# Patient Record
Sex: Female | Born: 1970 | Race: White | Hispanic: No | Marital: Married | Smoking: Never smoker
Health system: Southern US, Community
[De-identification: ages and names within clinical notes are randomized; demographics above are authoritative.]

## PROBLEM LIST (undated history)

## (undated) DIAGNOSIS — T7840XA Allergy, unspecified, initial encounter: Secondary | ICD-10-CM

## (undated) DIAGNOSIS — K219 Gastro-esophageal reflux disease without esophagitis: Secondary | ICD-10-CM

## (undated) DIAGNOSIS — N979 Female infertility, unspecified: Secondary | ICD-10-CM

## (undated) DIAGNOSIS — M1990 Unspecified osteoarthritis, unspecified site: Secondary | ICD-10-CM

## (undated) HISTORY — DX: Female infertility, unspecified: N97.9

## (undated) HISTORY — DX: Allergy, unspecified, initial encounter: T78.40XA

## (undated) HISTORY — PX: OTHER SURGICAL HISTORY: SHX169

## (undated) HISTORY — DX: Unspecified osteoarthritis, unspecified site: M19.90

## (undated) HISTORY — DX: Gastro-esophageal reflux disease without esophagitis: K21.9

---

## 2008-11-11 HISTORY — PX: LAPAROTOMY: SHX154

## 2010-10-18 ENCOUNTER — Encounter
Admission: RE | Admit: 2010-10-18 | Discharge: 2010-10-18 | Payer: Self-pay | Source: Home / Self Care | Admitting: Gynecology

## 2011-10-16 ENCOUNTER — Other Ambulatory Visit: Payer: Self-pay | Admitting: Gynecology

## 2011-10-16 DIAGNOSIS — Z1231 Encounter for screening mammogram for malignant neoplasm of breast: Secondary | ICD-10-CM

## 2011-11-11 ENCOUNTER — Ambulatory Visit: Payer: Self-pay

## 2011-11-25 ENCOUNTER — Ambulatory Visit
Admission: RE | Admit: 2011-11-25 | Discharge: 2011-11-25 | Disposition: A | Discharge status: Home / Self Care | Payer: BC Managed Care – PPO | Source: Ambulatory Visit | Attending: Gynecology | Admitting: Gynecology

## 2011-11-25 DIAGNOSIS — Z1231 Encounter for screening mammogram for malignant neoplasm of breast: Secondary | ICD-10-CM

## 2014-09-08 ENCOUNTER — Other Ambulatory Visit: Payer: Self-pay

## 2014-09-08 DIAGNOSIS — Z1231 Encounter for screening mammogram for malignant neoplasm of breast: Secondary | ICD-10-CM

## 2014-09-27 ENCOUNTER — Ambulatory Visit: Payer: BC Managed Care – PPO

## 2014-10-11 ENCOUNTER — Ambulatory Visit
Admission: RE | Admit: 2014-10-11 | Discharge: 2014-10-11 | Disposition: A | Discharge status: Home / Self Care | Payer: BC Managed Care – PPO | Source: Ambulatory Visit

## 2014-10-11 DIAGNOSIS — Z1231 Encounter for screening mammogram for malignant neoplasm of breast: Secondary | ICD-10-CM

## 2014-10-11 LAB — HM MAMMOGRAPHY

## 2015-04-07 LAB — HM PAP SMEAR

## 2015-05-24 ENCOUNTER — Encounter: Payer: Self-pay | Admitting: Behavioral Health

## 2015-05-24 ENCOUNTER — Telehealth: Payer: Self-pay | Admitting: Behavioral Health

## 2015-05-24 NOTE — Telephone Encounter (Signed)
Unable to reach patient at time of Pre-Visit Call.  Left message for patient to return call when available.    

## 2015-05-24 NOTE — Addendum Note (Signed)
Addended by: Harold BarbanBYRD, Reshonda Koerber E on: 05/24/2015 12:09 PM   Modules accepted: Medications

## 2015-05-24 NOTE — Telephone Encounter (Signed)
Pre-Visit Call completed with patient and chart updated.   Pre-Visit Info documented in Specialty Comments under [employer].    

## 2015-05-25 ENCOUNTER — Encounter: Payer: Self-pay | Admitting: Medical

## 2015-05-25 ENCOUNTER — Ambulatory Visit (HOSPITAL_BASED_OUTPATIENT_CLINIC_OR_DEPARTMENT_OTHER)
Admission: RE | Admit: 2015-05-25 | Discharge: 2015-05-25 | Disposition: A | Discharge status: Home / Self Care | Payer: BLUE CROSS/BLUE SHIELD | Source: Ambulatory Visit | Attending: Medical | Admitting: Medical

## 2015-05-25 ENCOUNTER — Ambulatory Visit (INDEPENDENT_AMBULATORY_CARE_PROVIDER_SITE_OTHER): Payer: BLUE CROSS/BLUE SHIELD | Admitting: Medical

## 2015-05-25 VITALS — BP 126/65 | HR 69 | Temp 98.2°F | Ht 65.75 in | Wt 155.4 lb

## 2015-05-25 DIAGNOSIS — M549 Dorsalgia, unspecified: Secondary | ICD-10-CM | POA: Insufficient documentation

## 2015-05-25 DIAGNOSIS — M940 Chondrocostal junction syndrome [Tietze]: Secondary | ICD-10-CM | POA: Diagnosis not present

## 2015-05-25 DIAGNOSIS — M1990 Unspecified osteoarthritis, unspecified site: Secondary | ICD-10-CM | POA: Diagnosis not present

## 2015-05-25 DIAGNOSIS — M5441 Lumbago with sciatica, right side: Secondary | ICD-10-CM

## 2015-05-25 DIAGNOSIS — M545 Low back pain: Secondary | ICD-10-CM | POA: Insufficient documentation

## 2015-05-25 DIAGNOSIS — J301 Allergic rhinitis due to pollen: Secondary | ICD-10-CM

## 2015-05-25 DIAGNOSIS — M5442 Lumbago with sciatica, left side: Secondary | ICD-10-CM

## 2015-05-25 DIAGNOSIS — J309 Allergic rhinitis, unspecified: Secondary | ICD-10-CM | POA: Insufficient documentation

## 2015-05-25 MED ORDER — DICLOFENAC SODIUM 75 MG PO TBEC
75.0000 mg | DELAYED_RELEASE_TABLET | Freq: Two times a day (BID) | ORAL | Status: AC
Start: 2015-05-25 — End: ?

## 2015-05-25 MED ORDER — HYDROCODONE-ACETAMINOPHEN 5-325 MG PO TABS: 1.0000 | ORAL_TABLET | Freq: Four times a day (QID) | ORAL | Status: AC | PRN

## 2015-05-25 MED ORDER — CYCLOBENZAPRINE HCL 10 MG PO TABS: 10.0000 mg | ORAL_TABLET | Freq: Every day | ORAL | Status: AC

## 2015-05-25 NOTE — Progress Notes (Signed)
Pre visit review using our clinic review tool, if applicable. No additional management support is needed unless otherwise documented below in the visit note. 

## 2015-05-25 NOTE — Assessment & Plan Note (Signed)
Diclofenac, flexeril, and norco. Back stretching exercises. If not improved by one week then refer to PT.  If any severe radiating pain to legs, numbness weakness, or foot drop then ED eval.  Follow up in 7 days or as needed

## 2015-05-25 NOTE — Progress Notes (Signed)
Subjective:    Patient ID: Melanie Stanley, female    DOB: 1971/02/08, 44 y.o.   MRN: [id number]  HPI   I have reviewed pt PMH, PSH, FH, Social History and Surgical History.  Allergist- in spring mostly. otc claritin or zytrec when needs.  Arthritis- pt states in hand. Told osteoarthritis. Use meloxicam in the past briefly.  Gerd- Pt states hx of omeprazole use. Uses 20 mg in past. No use for one month. Current nt no symptoms. No egd in past.   Costochondritis- rt lower rib area in past. Work up for GB in past was negative.    Pt in with low back pain that started after a trampoline class. Pt states doing this for about a month. Pt did one hour of class. 30 minute into class started to feel the pain. Recently on alleve. Pain started on Saturday. Pt states pain level 9 now. Some pain since fall radiates down her legs. No saddle anesthesia. No bladder incontinence.  Lower lumbar region pain and some lt SI area pain as well. SI area pain before the back pain.  Pt using heating pad and pillow.  Hx of lower back pain. In past occasional pain mild self limited. No treatment in past.  LMP- Mid June. Not late. Pt is going through fertility evaluation. But no treatment now.     Review of Systems  Constitutional: Negative for fever, chills and fatigue.  Respiratory: Negative for cough, chest tightness and wheezing.   Cardiovascular: Negative for chest pain and palpitations.  Gastrointestinal: Negative for nausea, vomiting and abdominal pain.  Genitourinary: Negative for dysuria, urgency, hematuria and flank pain.  Musculoskeletal: Positive for back pain. Negative for myalgias, joint swelling, arthralgias, neck pain and neck stiffness.  Skin: Negative for rash.  Neurological: Negative for weakness and numbness.  Hematological: Negative for adenopathy. Does not bruise/bleed easily.     Past Medical History  Diagnosis Date  . Osteoarthritis   . Infertility, female   . Allergy   .  GERD (gastroesophageal reflux disease)     History   Social History  . Marital Status: Married    Spouse Name: N/A  . Number of Children: N/A  . Years of Education: N/A   Occupational History  . Not on file.   Social History Main Topics  . Smoking status: Never Smoker   . Smokeless tobacco: Never Used  . Alcohol Use: 0.0 oz/week    0 Standard drinks or equivalent per week     Comment: 5 drinks on weekends  . Drug Use: No  . Sexual Activity: Yes   Other Topics Concern  . Not on file   Social History Narrative    Past Surgical History  Procedure Laterality Date  . Laparotomy  2010  . Laporotomy    . Laporoscopy      Family History  Problem Relation Age of Onset  . Arthritis Mother   . Arthritis Maternal Aunt   . Diabetes Maternal Aunt   . Arthritis Maternal Grandmother     No Known Allergies  Current Outpatient Prescriptions on File Prior to Visit  Medication Sig Dispense Refill  . Multiple Vitamins-Minerals (MULTIVITAMIN ADULT PO) Take by mouth daily.    . Naproxen Sodium (ALEVE PO) Take by mouth as needed.     No current facility-administered medications on file prior to visit.    BP 126/65 mmHg  Pulse 69  Temp(Src) 98.2 F (36.8 C) (Oral)  Ht 5' 5.75" (1.67 m)  Wt 155 lb 6.4 oz (70.489 kg)  BMI 25.27 kg/m2  SpO2 100%  LMP 04/26/2015       Objective:   Physical Exam   General Appearance- Not in acute distress.    Chest and Lung Exam Auscultation: Breath sounds:-Normal. Clear even and unlabored. Adventitious sounds:- No Adventitious sounds.  Cardiovascular Auscultation:Rythm - Regular, rate and rythm. Heart Sounds -Normal heart sounds.  Abdomen Inspection:-Inspection Normal.  Palpation/Perucssion: Palpation and Percussion of the abdomen reveal- Non Tender, No Rebound tenderness, No rigidity(Guarding) and No Palpable abdominal masses.  Liver:-Normal.  Spleen:- Normal.   Back Mid lumbar spine tenderness to palpation. Pain on  straight leg lift. Pain on lateral movements and flexion/extension of the spine.  Lower ext neurologic  L5-S1 sensation intact bilaterally. Normal patellar reflexes bilaterally. No foot drop bilaterally.     Assessment & Plan:

## 2015-05-25 NOTE — Patient Instructions (Addendum)
Back pain Diclofenac, flexeril, and norco. Back stretching exercises. If not improved by one week then refer to PT.  If any severe radiating pain to legs, numbness weakness, or foot drop then ED eval.  Follow up in 7 days or as needed  Allergic rhinitis - in spring mostly. otc claritin or zytrec when needs.  Arthritis pt states in hand. Told osteoarthritis. Use meloxicam in the past briefly.  Costochondritis rt lower rib area in past. Work up for GB in past was negative.

## 2015-05-25 NOTE — Assessment & Plan Note (Signed)
pt states in hand. Told osteoarthritis. Use meloxicam in the past briefly.

## 2015-05-25 NOTE — Assessment & Plan Note (Signed)
rt lower rib area in past. Work up for GB in past was negative.

## 2015-05-25 NOTE — Assessment & Plan Note (Signed)
-   in spring mostly. otc claritin or zytrec when needs.

## 2015-12-01 ENCOUNTER — Other Ambulatory Visit: Payer: Self-pay

## 2015-12-01 DIAGNOSIS — Z1231 Encounter for screening mammogram for malignant neoplasm of breast: Secondary | ICD-10-CM

## 2015-12-25 ENCOUNTER — Ambulatory Visit
Admission: RE | Admit: 2015-12-25 | Discharge: 2015-12-25 | Disposition: A | Discharge status: Home / Self Care | Payer: BLUE CROSS/BLUE SHIELD | Source: Ambulatory Visit

## 2015-12-25 DIAGNOSIS — Z1231 Encounter for screening mammogram for malignant neoplasm of breast: Secondary | ICD-10-CM

## 2016-07-15 IMAGING — DX DG LUMBAR SPINE 2-3V
3 series · 3 of 3 positions shown · non-contrast
Comparison: None.

CLINICAL DATA: Low back pain. Pain radiates into lower extremities.

EXAM:
LUMBAR SPINE - 2-3 VIEW

[l-spine ap]
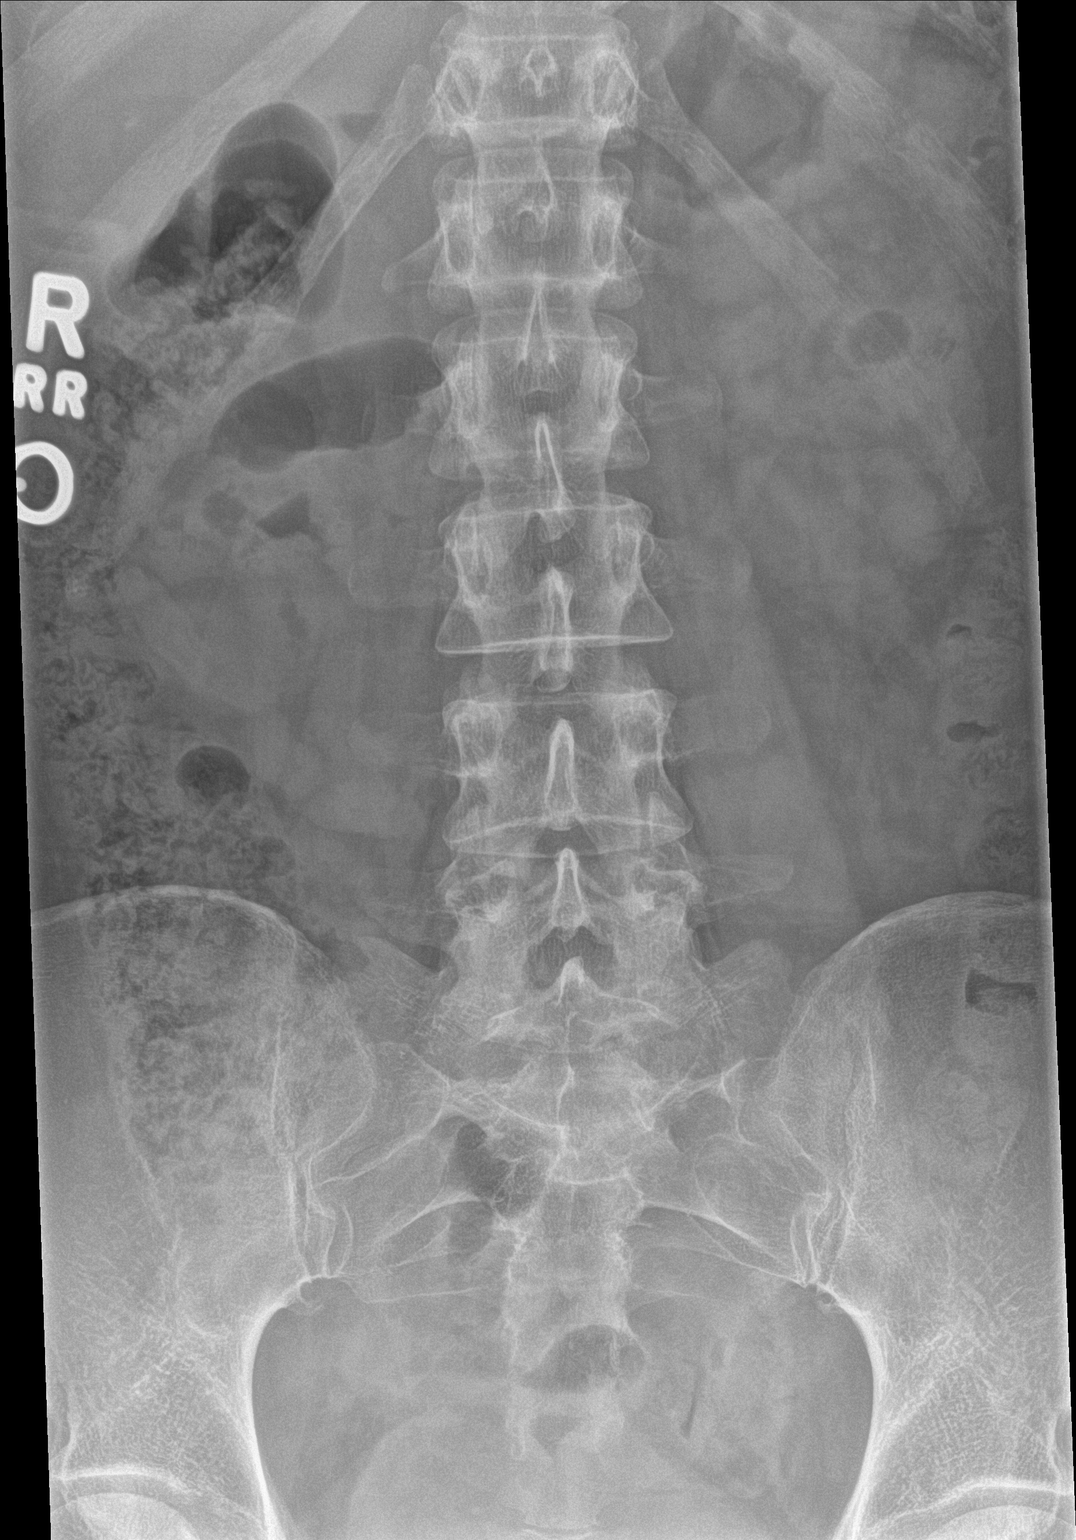

[l-spine lat]
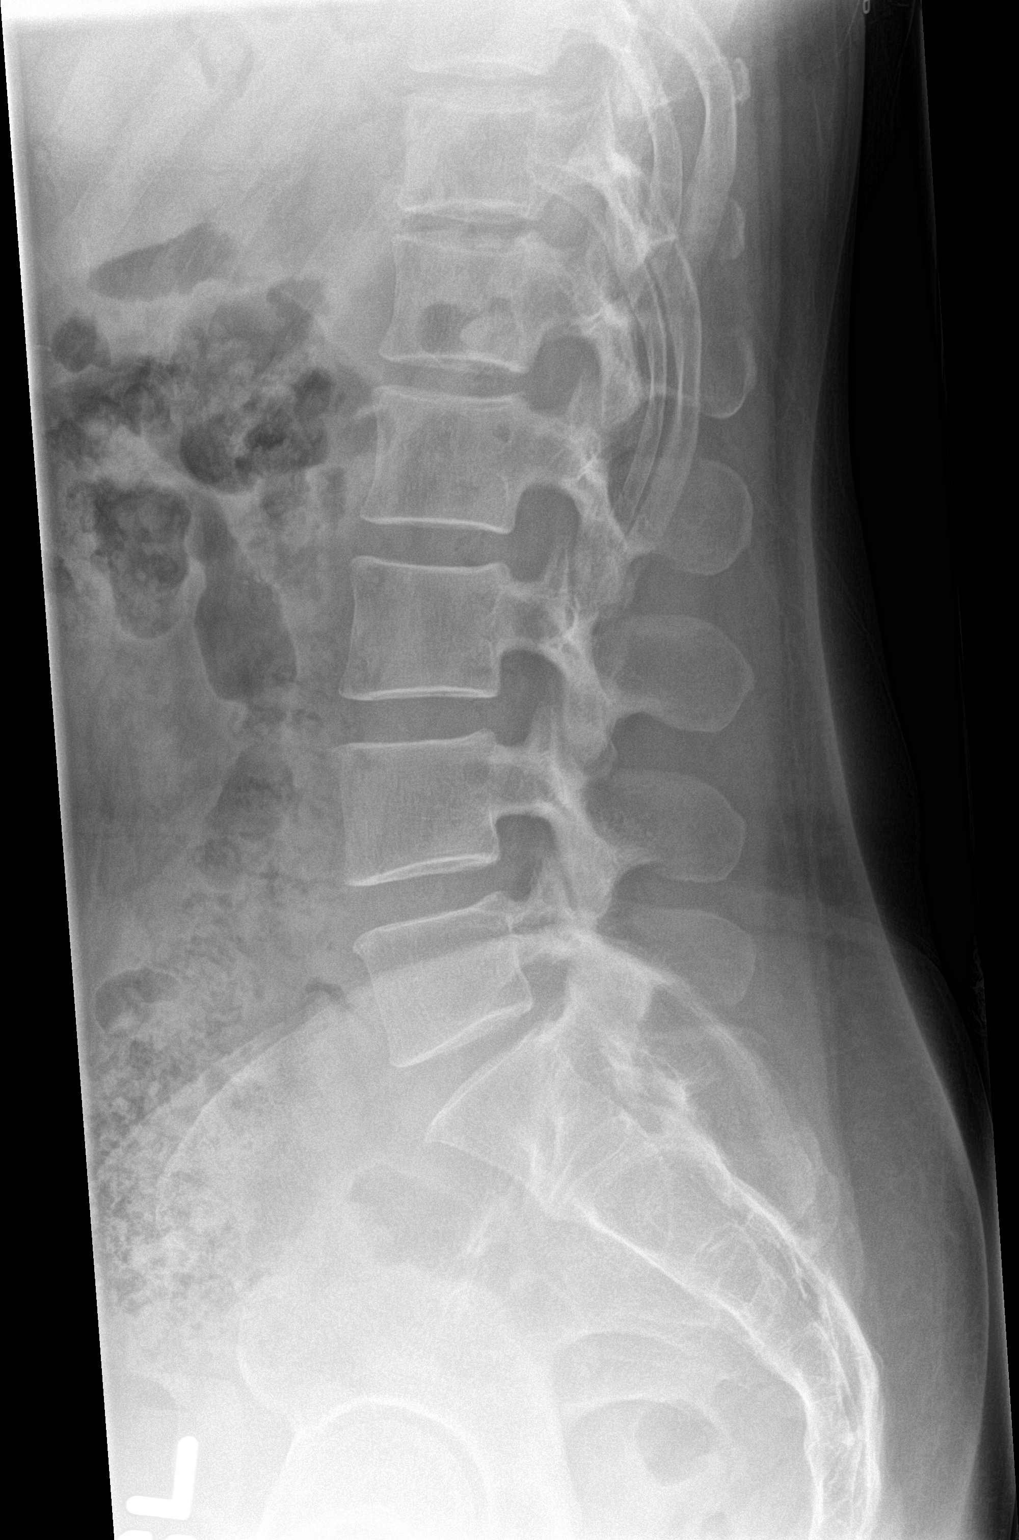

[l-spine spot]
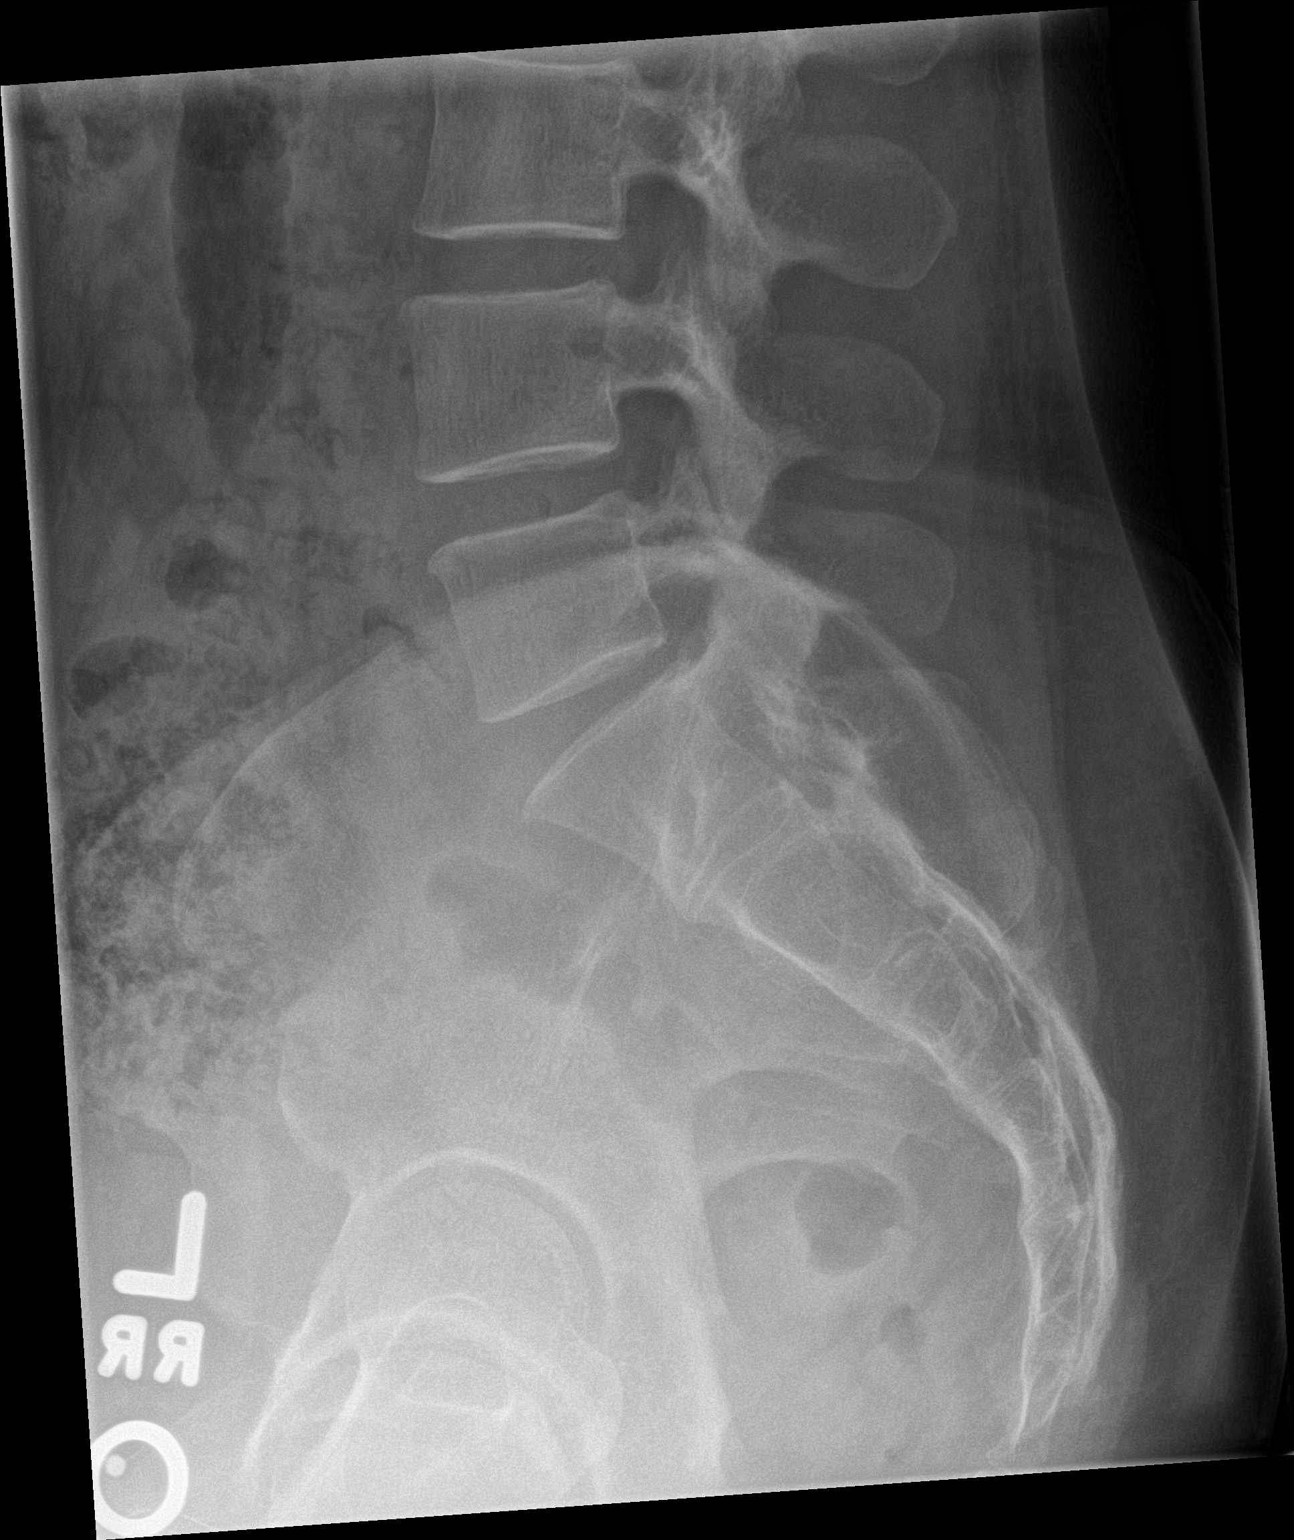

[3 of 3 positions shown; findings below may reference images not displayed]

FINDINGS: Paraspinal soft tissues are unremarkable. No acute bony abnormality
identified . Normal bony alignment and mineralization.
IMPRESSION: No acute or focal bony abnormality identified.

## 2018-03-18 ENCOUNTER — Other Ambulatory Visit: Payer: Self-pay | Admitting: Emergency Medicine

## 2018-03-18 DIAGNOSIS — Z1231 Encounter for screening mammogram for malignant neoplasm of breast: Secondary | ICD-10-CM

## 2018-04-08 ENCOUNTER — Ambulatory Visit: Payer: BLUE CROSS/BLUE SHIELD

## 2018-05-11 ENCOUNTER — Ambulatory Visit: Payer: BLUE CROSS/BLUE SHIELD

## 2018-08-12 ENCOUNTER — Ambulatory Visit
Admission: RE | Admit: 2018-08-12 | Discharge: 2018-08-12 | Disposition: A | Discharge status: Home / Self Care | Payer: BLUE CROSS/BLUE SHIELD | Source: Ambulatory Visit | Attending: Emergency Medicine | Admitting: Emergency Medicine

## 2018-08-12 DIAGNOSIS — Z1231 Encounter for screening mammogram for malignant neoplasm of breast: Secondary | ICD-10-CM

## 2020-06-28 ENCOUNTER — Other Ambulatory Visit: Payer: Self-pay | Admitting: Obstetrics and Gynecology

## 2020-06-28 DIAGNOSIS — Z1231 Encounter for screening mammogram for malignant neoplasm of breast: Secondary | ICD-10-CM

## 2020-07-14 ENCOUNTER — Ambulatory Visit: Payer: BLUE CROSS/BLUE SHIELD

## 2020-07-28 ENCOUNTER — Ambulatory Visit: Payer: Self-pay

## 2020-08-11 ENCOUNTER — Ambulatory Visit: Payer: Self-pay

## 2020-09-01 ENCOUNTER — Ambulatory Visit: Payer: Self-pay

## 2020-09-04 ENCOUNTER — Ambulatory Visit
Admission: RE | Admit: 2020-09-04 | Discharge: 2020-09-04 | Disposition: A | Discharge status: Home / Self Care | Payer: BC Managed Care – PPO | Source: Ambulatory Visit | Attending: Obstetrics and Gynecology | Admitting: Obstetrics and Gynecology

## 2020-09-04 ENCOUNTER — Other Ambulatory Visit: Payer: Self-pay

## 2020-09-04 DIAGNOSIS — Z1231 Encounter for screening mammogram for malignant neoplasm of breast: Secondary | ICD-10-CM

## 2021-10-26 IMAGING — MG DIGITAL SCREENING BILAT W/ TOMO W/ CAD
8 series · 8 of 24 positions shown · non-contrast
Comparison: Previous exam(s).

CLINICAL DATA: Screening.

EXAM:
DIGITAL SCREENING BILATERAL MAMMOGRAM WITH TOMO AND CAD

[R MLO synth-2D]
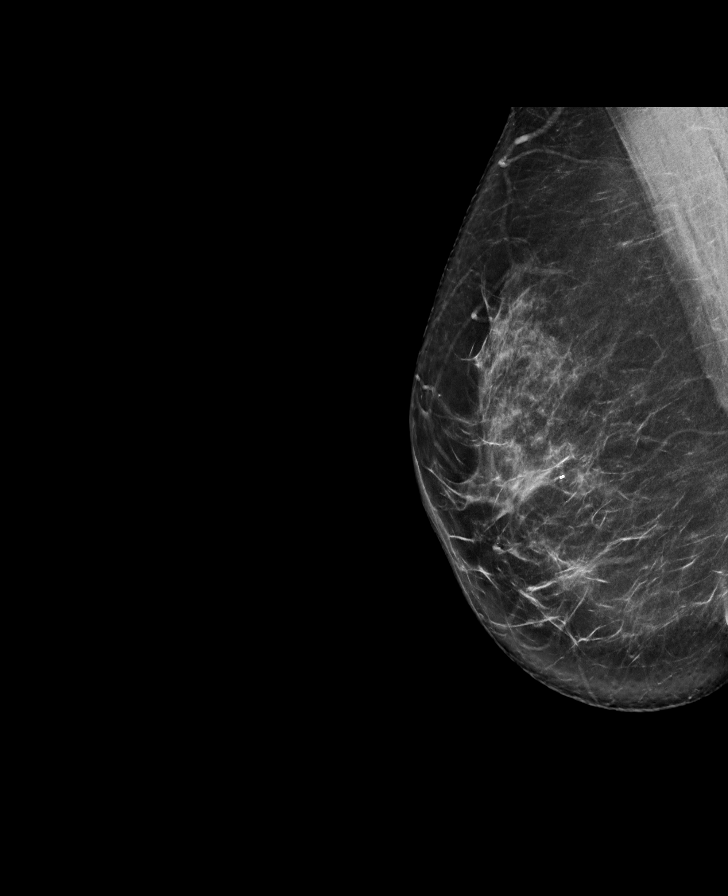

[L MLO synth-2D]
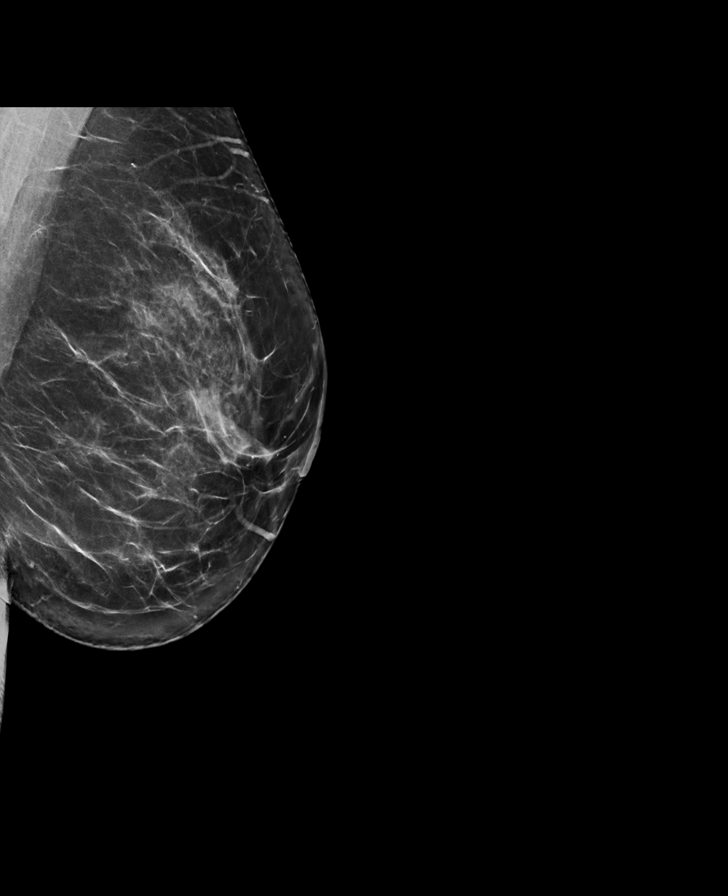

[L CC synth-2D]
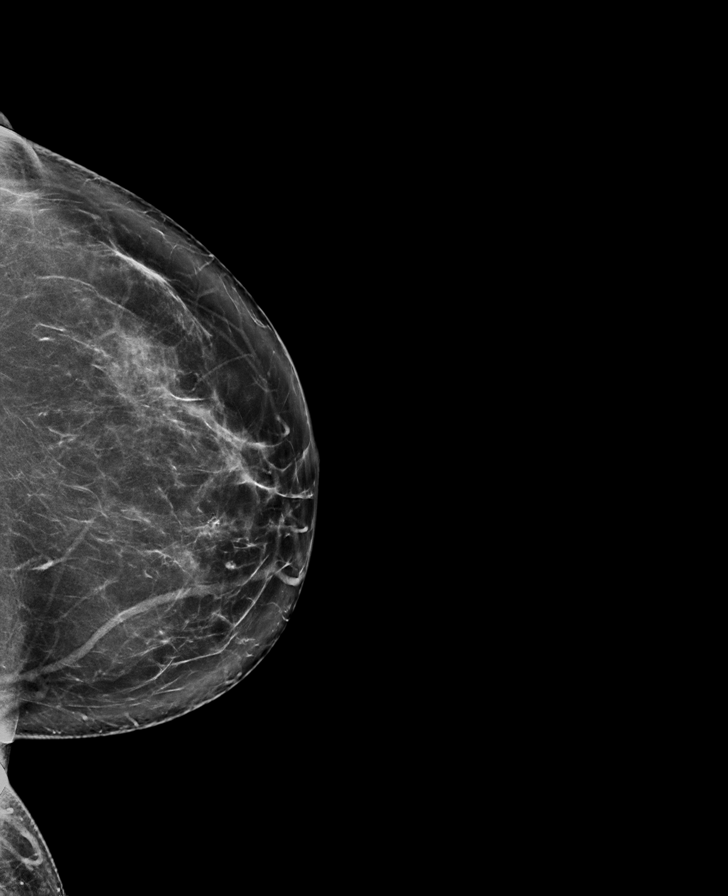

[R CC synth-2D]
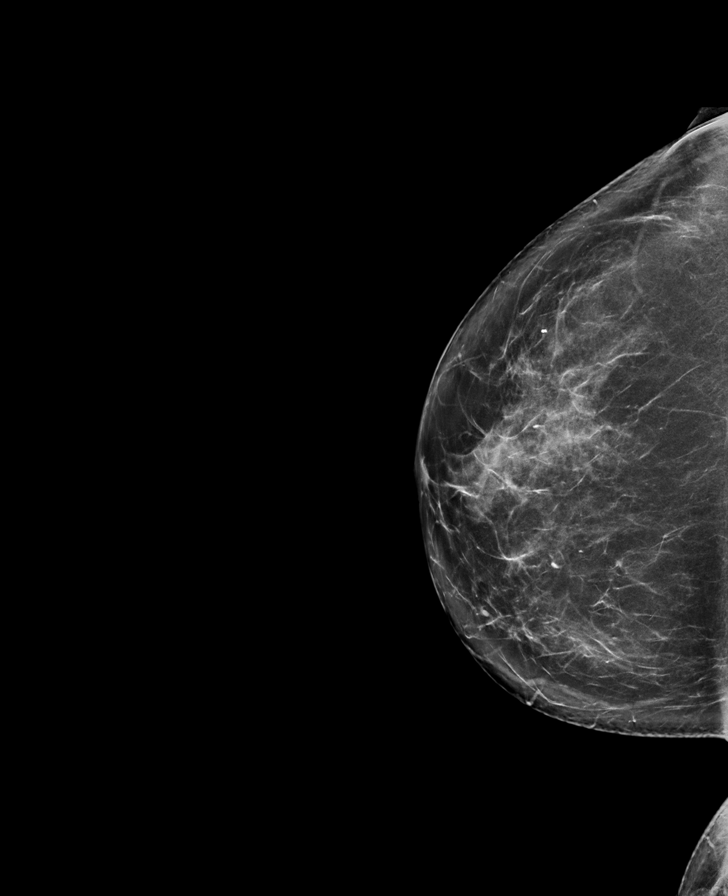

[R CC tomo · tomo slice 41/81.0]
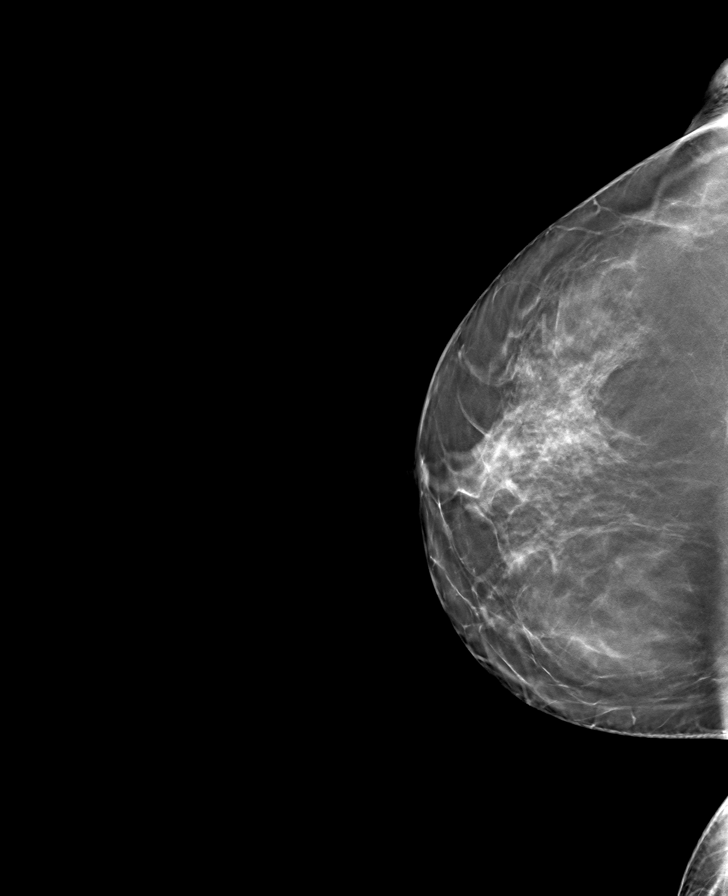

[L CC tomo · tomo slice 42/83.0]
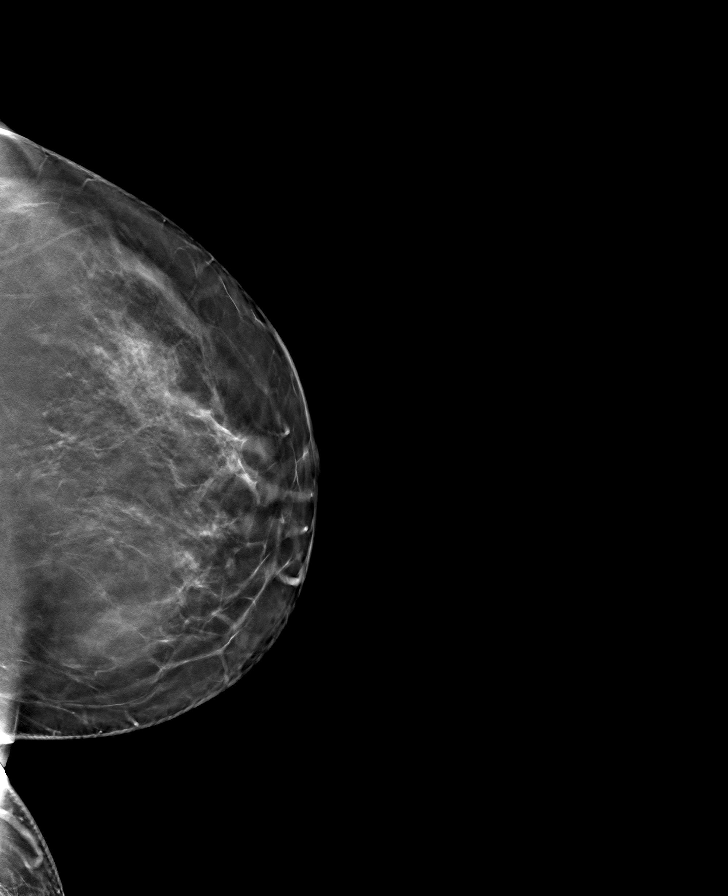

[R MLO tomo · tomo slice 41/82.0]
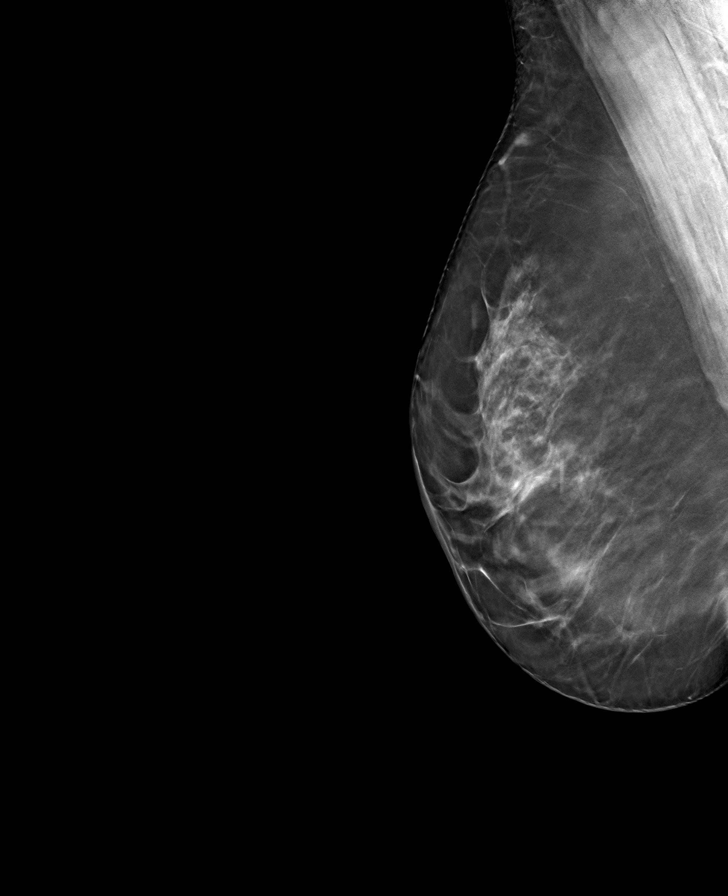

[L MLO tomo · tomo slice 41/82.0]
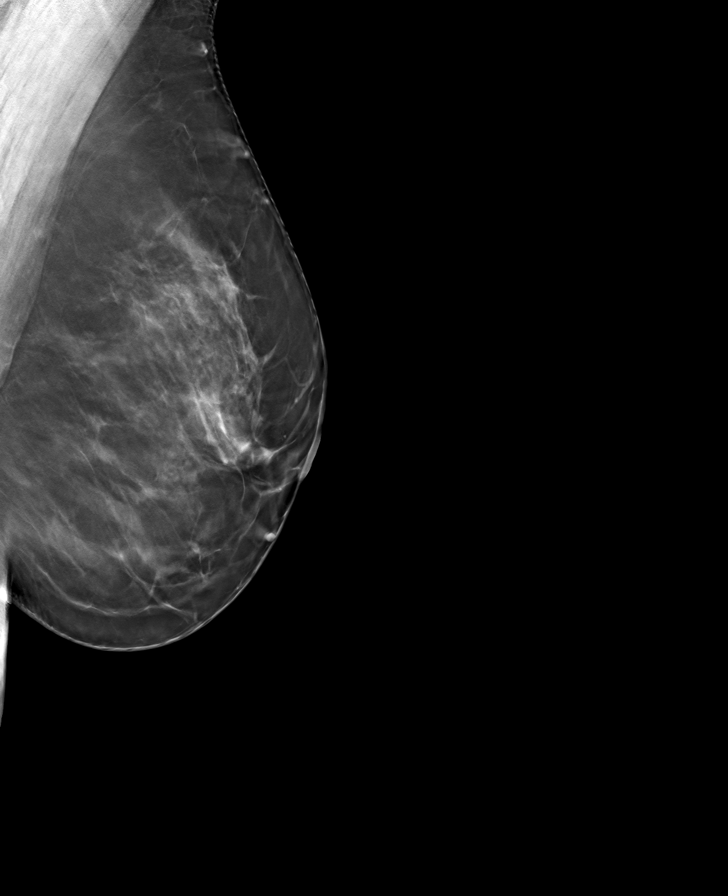

[8 of 24 positions shown; findings below may reference images not displayed]

ACR Breast Density Category c: The breast tissue is heterogeneously
dense, which may obscure small masses.
FINDINGS: There are no findings suspicious for malignancy. Images were
processed with CAD.
IMPRESSION: No mammographic evidence of malignancy. A result letter of this
screening mammogram will be mailed directly to the patient.

RECOMMENDATION:
Screening mammogram in one year. (Code:FT-U-LHB)

BI-RADS CATEGORY  1: Negative.

## 2025-01-27 ENCOUNTER — Ambulatory Visit (HOSPITAL_BASED_OUTPATIENT_CLINIC_OR_DEPARTMENT_OTHER): Admitting: Family Medicine
